# Patient Record
Sex: Female | Born: 1965 | Race: Black or African American | Hispanic: No | Marital: Married | Smoking: Never smoker
Health system: Southern US, Community
[De-identification: ages and names within clinical notes are randomized; demographics above are authoritative.]

## PROBLEM LIST (undated history)

## (undated) DIAGNOSIS — E785 Hyperlipidemia, unspecified: Secondary | ICD-10-CM

## (undated) DIAGNOSIS — K259 Gastric ulcer, unspecified as acute or chronic, without hemorrhage or perforation: Secondary | ICD-10-CM

## (undated) DIAGNOSIS — R12 Heartburn: Secondary | ICD-10-CM

---

## 2015-11-13 ENCOUNTER — Emergency Department (HOSPITAL_COMMUNITY)
Admission: EM | Admit: 2015-11-13 | Discharge: 2015-11-14 | Disposition: A | Discharge status: Home / Self Care | Payer: Self-pay | Attending: Emergency Medicine | Admitting: Emergency Medicine

## 2015-11-13 ENCOUNTER — Emergency Department (HOSPITAL_COMMUNITY): Payer: Self-pay

## 2015-11-13 ENCOUNTER — Encounter (HOSPITAL_COMMUNITY): Payer: Self-pay | Admitting: Emergency Medicine

## 2015-11-13 ENCOUNTER — Other Ambulatory Visit: Payer: Self-pay

## 2015-11-13 DIAGNOSIS — R079 Chest pain, unspecified: Secondary | ICD-10-CM | POA: Insufficient documentation

## 2015-11-13 DIAGNOSIS — K5900 Constipation, unspecified: Secondary | ICD-10-CM | POA: Insufficient documentation

## 2015-11-13 DIAGNOSIS — Z8639 Personal history of other endocrine, nutritional and metabolic disease: Secondary | ICD-10-CM | POA: Insufficient documentation

## 2015-11-13 DIAGNOSIS — F419 Anxiety disorder, unspecified: Secondary | ICD-10-CM | POA: Insufficient documentation

## 2015-11-13 DIAGNOSIS — R1013 Epigastric pain: Secondary | ICD-10-CM

## 2015-11-13 DIAGNOSIS — R55 Syncope and collapse: Secondary | ICD-10-CM | POA: Insufficient documentation

## 2015-11-13 DIAGNOSIS — N390 Urinary tract infection, site not specified: Secondary | ICD-10-CM | POA: Insufficient documentation

## 2015-11-13 HISTORY — DX: Heartburn: R12

## 2015-11-13 HISTORY — DX: Gastric ulcer, unspecified as acute or chronic, without hemorrhage or perforation: K25.9

## 2015-11-13 HISTORY — DX: Hyperlipidemia, unspecified: E78.5

## 2015-11-13 LAB — CBC
HEMATOCRIT: 34.1 % — AB (ref 36.0–46.0)
HEMOGLOBIN: 11.4 g/dL — AB (ref 12.0–15.0)
MCH: 30.2 pg (ref 26.0–34.0)
MCHC: 33.4 g/dL (ref 30.0–36.0)
MCV: 90.5 fL (ref 78.0–100.0)
Platelets: 169 10*3/uL (ref 150–400)
RBC: 3.77 MIL/uL — AB (ref 3.87–5.11)
RDW: 14.6 % (ref 11.5–15.5)
WBC: 6.2 10*3/uL (ref 4.0–10.5)

## 2015-11-13 LAB — BASIC METABOLIC PANEL
Anion gap: 12 (ref 5–15)
BUN: 17 mg/dL (ref 6–20)
CHLORIDE: 104 mmol/L (ref 101–111)
CO2: 23 mmol/L (ref 22–32)
Calcium: 9.5 mg/dL (ref 8.9–10.3)
Creatinine, Ser: 0.78 mg/dL (ref 0.44–1.00)
GFR calc non Af Amer: >60 mL/min (ref >60)
Glucose, Bld: 88 mg/dL (ref 65–99)
POTASSIUM: 4 mmol/L (ref 3.5–5.1)
SODIUM: 139 mmol/L (ref 135–145)

## 2015-11-13 LAB — D-DIMER, QUANTITATIVE (NOT AT ARMC): D DIMER QUANT: 0.48 ug{FEU}/mL (ref 0.00–0.50)

## 2015-11-13 LAB — I-STAT TROPONIN, ED: Troponin i, poc: 0 ng/mL (ref 0.00–0.08)

## 2015-11-13 MED ORDER — ONDANSETRON 4 MG PO TBDP
4.0000 mg | ORAL_TABLET | Freq: Once | ORAL | Status: AC
  Administered 2015-11-13: 4 mg via ORAL
  Filled 2015-11-13: qty 1

## 2015-11-13 MED ORDER — GI COCKTAIL ~~LOC~~
30.0000 mL | Freq: Once | ORAL | Status: AC
  Administered 2015-11-13: 30 mL via ORAL
  Filled 2015-11-13: qty 30

## 2015-11-13 NOTE — ED Notes (Signed)
Cup of ice water provided to patient for fluid challenge.  

## 2015-11-13 NOTE — ED Provider Notes (Signed)
CSN: [id number]     Arrival date & time 11/13/15  1909 History   First MD Initiated Contact with Patient 11/13/15 1913     Chief Complaint  Patient presents with  . Chest Pain     (Consider location/radiation/quality/duration/timing/severity/associated sxs/prior Treatment) The history is provided by the patient and a relative. The history is limited by a language barrier. A language interpreter was used (Patient children assisted with language interpretation).     Patient is a 50 year old female, history of heartburn, gastric ulcers, hyperlipidemia, who presents to the ER for low sternal and epigastric pain with radiation around her left abdomen and side and to her back with associated nausea and 2 episodes of vomiting. The pain is described as burning, rated 10 out of 10 at its worst. Patient states that she has had 2 episodes in the last week similar to this where she feels anxious, her heart pounding rapidly, she hyperventilates, feels nauseated, then vomits and feels much better.  She occasionally has dyspepsia with nausea, and pain and nausea are relieved by belching. Her daughter states that when this occurred approximately one week ago had a syncopal episode.  The daughter explained that she called 911 today because this is the first time she had ever seen one of her mother's episodes, since she just moved here from [place].  She called 911 because she did not want her mother to have another syncopal episode.  The patient states, and her son confirms that she has had episodes like this regularly with feelings of anxiety, for the past 2 decades.  The patient currently denies chest pain, chest tightness, shortness of breath, cough, wheeze, fever, chills, diarrhea, lower extremity edema, lower extremity erythema, lightheadedness, headache, neck pain.  Patient has had no change in the frequency of her symptoms since flying to [place] 2 weeks ago.  Patient also reports chronic constiptaiton, and  often has one week in between her bowel movements.  Her last bowel movement was yesterday. She denies melena, hematochezia, hematemesis.  The patient also states that she's been having dysuria and tenderness across her lower abdomen that when she presses on it radiates pain across her upper abdomen.  The dysuria has been intermittent. She denies hematuria, flank pain, vaginal symptoms.  Past Medical History  Diagnosis Date  . Hyperlipidemia   . Heartburn   . Gastric ulcer    History reviewed. No pertinent past surgical history. History reviewed. No pertinent family history. Social History  Substance Use Topics  . Smoking status: Never Smoker   . Smokeless tobacco: None  . Alcohol Use: No   OB History    No data available     Review of Systems  Constitutional: Negative for fever, chills, diaphoresis, activity change, appetite change and fatigue.  HENT: Negative.   Gastrointestinal: Positive for nausea, vomiting, abdominal pain and constipation. Negative for diarrhea, blood in stool, abdominal distention, anal bleeding and rectal pain.  Genitourinary: Positive for dysuria, urgency and frequency. Negative for difficulty urinating.  Musculoskeletal: Negative.   Skin: Negative.   Neurological: Positive for syncope. Negative for tremors and weakness.  Hematological: Negative.   Psychiatric/Behavioral: The patient is nervous/anxious.   All other systems reviewed and are negative.     Allergies  Review of patient's allergies indicates no known allergies.  Home Medications   Prior to Admission medications   Medication Sig Start Date End Date Taking? Authorizing Provider  cephALEXin (KEFLEX) 500 MG capsule Take 1 capsule (500 mg total) by mouth 4 (  four) times daily. 11/14/15   Danelle Berry, PA-C  pantoprazole (PROTONIX) 20 MG tablet Take 1 tablet (20 mg total) by mouth daily. 11/14/15   Danelle Berry, PA-C  polyethylene glycol powder (GLYCOLAX/MIRALAX) powder Take 17 g by mouth 2 (two)  times daily. 11/14/15   Ishita Mcnerney, PA-C   BP 101/60 mmHg  Pulse 61  Temp(Src) 98 F (36.7 C) (Oral)  Resp 12  SpO2 99% Physical Exam  Constitutional: She is oriented to person, place, and time. She appears well-developed and well-nourished. No distress.  HENT:  Head: Normocephalic and atraumatic.  Nose: Nose normal.  Mouth/Throat: No oropharyngeal exudate.  Oral mucosa dry  Eyes: Conjunctivae and EOM are normal. Pupils are equal, round, and reactive to light. Right eye exhibits no discharge. Left eye exhibits no discharge. No scleral icterus.  Neck: Normal range of motion. No JVD present. No tracheal deviation present. No thyromegaly present.  Cardiovascular: Normal rate, regular rhythm, normal heart sounds and intact distal pulses.  Exam reveals no gallop and no friction rub.   No murmur heard. Symmetrical 2+ radial and DP pulses No LE edema  Pulmonary/Chest: Effort normal and breath sounds normal. No respiratory distress. She has no wheezes. She has no rales. She exhibits no tenderness.  Abdominal: Soft. Normal appearance and bowel sounds are normal. She exhibits no distension, no fluid wave, no ascites and no mass. There is generalized tenderness. There is no rigidity, no rebound, no guarding, no CVA tenderness, no tenderness at McBurney's point and negative Murphy's sign.    Musculoskeletal: Normal range of motion. She exhibits no edema or tenderness.  Lymphadenopathy:    She has no cervical adenopathy.  Neurological: She is alert and oriented to person, place, and time. She has normal reflexes. No cranial nerve deficit. She exhibits normal muscle tone. Coordination normal.  Skin: Skin is warm and dry. No rash noted. She is not diaphoretic. No erythema. No pallor.  Psychiatric: She has a normal mood and affect. Her behavior is normal. Judgment and thought content normal.  Nursing note and vitals reviewed.   ED Course  Procedures (including critical care time) Labs  Review Labs Reviewed  CBC - Abnormal; Notable for the following:    RBC 3.77 (*)    Hemoglobin 11.4 (*)    HCT 34.1 (*)    All other components within normal limits  URINALYSIS, ROUTINE W REFLEX MICROSCOPIC (NOT AT [facility]) - Abnormal; Notable for the following:    APPearance CLOUDY (*)    Leukocytes, UA MODERATE (*)    All other components within normal limits  URINE MICROSCOPIC-ADD ON - Abnormal; Notable for the following:    Squamous Epithelial / LPF 0-5 (*)    Bacteria, UA FEW (*)    All other components within normal limits  BASIC METABOLIC PANEL  D-DIMER, QUANTITATIVE (NOT AT [facility])  I-STAT TROPOININ, ED  I-STAT TROPOININ, ED  Rosezena Sensor, ED    Imaging Review Dg Chest 2 View  11/13/2015  CLINICAL DATA:  Chest pain for 1 day EXAM: CHEST  2 VIEW COMPARISON:  None. FINDINGS: Mild cardiomegaly. No overt edema, confluent opacity or effusion. No acute bony abnormality. IMPRESSION: Cardiomegaly.  No active disease. Electronically Signed   By: Charlett Nose M.D.   On: 11/13/2015 20:13   Dg Abd 2 Views  11/13/2015  CLINICAL DATA:  50 year old female with epigastric pain and nausea intermittently for the past several months. EXAM: ABDOMEN - 2 VIEW COMPARISON:  No priors. FINDINGS: Gas and stool are seen  scattered throughout the colon extending to the level of the distal rectum. No pathologic distension of small bowel is noted. No gross evidence of pneumoperitoneum. Multiple calcific densities projecting over the lower abdomen and pelvis bilaterally, presumably soft tissue calcifications in the overlying skin and subcutaneous fat. IMPRESSION: 1.  Nonobstructive bowel gas pattern. 2. No pneumoperitoneum. Electronically Signed   By: Trudie Reed M.D.   On: 11/13/2015 22:15   I have personally reviewed and evaluated these images and lab results as part of my medical decision-making.   EKG Interpretation   Date/Time:  Tuesday November 13 2015 19:48:23 EST Ventricular Rate:  65 PR  Interval:  146 QRS Duration: 80 QT Interval:  427 QTC Calculation: 444 R Axis:   78 Text Interpretation:  Sinus rhythm Nonspecific T abnrm, anterolateral  leads ST elev, probable normal early repol pattern no significant change  since earlier in the day Confirmed by GOLDSTON  MD, SCOTT ([id number]) on  11/13/2015 8:06:46 PM      MDM   Patient arrived to the ER via EMS with complaints of chest pain.  When obtaining history the patient states that she has only had chest and epigastric pain associated with anxiety, with rapid heart rate, hyperventilation, nausea and vomiting which then relieved all of her symptoms.  Cardiac workup was initiated prior to my evaluation.  In route EMS administered Zofran, 324 aspirin and 2 nitroglycerin.  The patient was well-appearing, in no distress, vital signs stable History was more concerning for epigastric abdominal pain, GERD versus gastritis versus peptic ulcer disease.  Patient's chest pain was not consistent with ACS however given limited history due to language barriers and reports of syncope with rapid heart rate, CP work up completed.  Chest x-ray, two-view of the abdomen, d-dimer and urinalysis added.  The patient later reported dysuria and chronic constipation.  She reported significant improvement in her epigastric pain with GI cocktail.  Initial troponin negative, labs pertinent for mild anemia, d-dimer negative, chest x-ray negative for acute cardiopulmonary pathology, mild cardiomegaly.  Abdominal x-rays reveal gas and stool scattered throughout, consistent with patient's history.  Urine consistent with UTI.  Delta troponin negative.  The patient was presented to Dr. Criss Alvine, who is personally seen and evaluated the patient and agrees with workup and assessment.  Patient has been monitored for several hours in the ER without any repeated episodes.  She has had no nausea, vomiting and vital signs are stable and within normal limits.  Feel patient can  safely discharge with outpatient follow-up.  Patient will be treated with PPI trial is for upper GI symptoms, given MiraLAX for chronic constipation, and given Keflex for UTI.  Pt d/c home in stable condition.  VSS  Filed Vitals:   11/13/15 2130 11/13/15 2345 11/14/15 0015 11/14/15 0045  BP: 114/74 108/80 121/58 101/60  Pulse: 61 62 53 61  Temp:      TempSrc:      Resp: 12     SpO2: 96% 99% 96% 99%    Final diagnoses:  Epigastric pain  Constipation, unspecified constipation type  UTI (lower urinary tract infection)  Chest pain, unspecified chest pain type     Danelle Berry, PA-C 11/14/15 0207  Pricilla Loveless, MD 11/18/15 [phone]

## 2015-11-13 NOTE — ED Notes (Signed)
Pt arrives by [facility] with c/o of chest pain. Woke up this morning with pain in mid sternal region radiating to right side and back 10/10 as well as upper abdominal pain, nausea and vomiting. Denies fever, chills, and diarrhea. Pt has been in [place] for 2 weeks from [place]. EMS 12-lead shows NSR. 22G placed in left hand,  Zofran, 324 aspirin and 2 nitro given. CBG 96. Last vitals 124/72, P 80, 97% RA.

## 2015-11-14 LAB — URINALYSIS, ROUTINE W REFLEX MICROSCOPIC
BILIRUBIN URINE: NEGATIVE
GLUCOSE, UA: NEGATIVE mg/dL
HGB URINE DIPSTICK: NEGATIVE
KETONES UR: NEGATIVE mg/dL
Nitrite: NEGATIVE
PROTEIN: NEGATIVE mg/dL
Specific Gravity, Urine: 1.012 (ref 1.005–1.030)
pH: 7.5 (ref 5.0–8.0)

## 2015-11-14 LAB — I-STAT TROPONIN, ED: TROPONIN I, POC: 0.03 ng/mL (ref 0.00–0.08)

## 2015-11-14 LAB — URINE MICROSCOPIC-ADD ON

## 2015-11-14 MED ORDER — CEPHALEXIN 500 MG PO CAPS: 500.0000 mg | ORAL_CAPSULE | Freq: Four times a day (QID) | ORAL | Status: DC

## 2015-11-14 MED ORDER — POLYETHYLENE GLYCOL 3350 17 GM/SCOOP PO POWD: 17.0000 g | Freq: Two times a day (BID) | ORAL | Status: DC

## 2015-11-14 MED ORDER — PANTOPRAZOLE SODIUM 20 MG PO TBEC: 20.0000 mg | DELAYED_RELEASE_TABLET | Freq: Every day | ORAL | Status: DC

## 2015-11-14 NOTE — Discharge Instructions (Signed)
Abdominal Pain, Adult °Many things can cause abdominal pain. Usually, abdominal pain is not caused by a disease and will improve without treatment. It can often be observed and treated at home. Your health care provider will do a physical exam and possibly order blood tests and X-rays to help determine the seriousness of your pain. However, in many cases, more time must pass before a clear cause of the pain can be found. Before that point, your health care provider may not know if you need more testing or further treatment. °HOME CARE INSTRUCTIONS °Monitor your abdominal pain for any changes. The following actions may help to alleviate any discomfort you are experiencing: °· Only take over-the-counter or prescription medicines as directed by your health care provider. °· Do not take laxatives unless directed to do so by your health care provider. °· Try a clear liquid diet (broth, tea, or water) as directed by your health care provider. Slowly move to a bland diet as tolerated. °SEEK MEDICAL CARE IF: °· You have unexplained abdominal pain. °· You have abdominal pain associated with nausea or diarrhea. °· You have pain when you urinate or have a bowel movement. °· You experience abdominal pain that wakes you in the night. °· You have abdominal pain that is worsened or improved by eating food. °· You have abdominal pain that is worsened with eating fatty foods. °· You have a fever. °SEEK IMMEDIATE MEDICAL CARE IF: °· Your pain does not go away within 2 hours. °· You keep throwing up (vomiting). °· Your pain is felt only in portions of the abdomen, such as the right side or the left lower portion of the abdomen. °· You pass bloody or black tarry stools. °MAKE SURE YOU: °· Understand these instructions. °· Will watch your condition. °· Will get help right away if you are not doing well or get worse. °  °This information is not intended to replace advice given to you by your health care provider. Make sure you discuss  any questions you have with your health care provider. °  °Document Released: 07/23/2005 Document Revised: 07/04/2015 Document Reviewed: 06/22/2013 °Elsevier Interactive Patient Education ©2016 [employer] ° °Constipation, Adult °Constipation is when a person: °· Poops (has a bowel movement) less than 3 times a week. °· Has a hard time pooping. °· Has poop that is dry, hard, or bigger than normal. °HOME CARE  °· Eat foods with a lot of fiber in them. This includes fruits, vegetables, beans, and whole grains such as brown rice. °· Avoid fatty foods and foods with a lot of sugar. This includes french fries, hamburgers, cookies, candy, and soda. °· If you are not getting enough fiber from food, take products with added fiber in them (supplements). °· Drink enough fluid to keep your pee (urine) clear or pale yellow. °· Exercise on a regular basis, or as told by your doctor. °· Go to the restroom when you feel like you need to poop. Do not hold it. °· Only take medicine as told by your doctor. Do not take medicines that help you poop (laxatives) without talking to your doctor first. °GET HELP RIGHT AWAY IF:  °· You have bright red blood in your poop (stool). °· Your constipation lasts more than 4 days or gets worse. °· You have belly (abdominal) or butt (rectal) pain. °· You have thin poop (as thin as a pencil). °· You lose weight, and it cannot be explained. °MAKE SURE YOU:  °· Understand these instructions. °·   Will watch your condition.  Will get help right away if you are not doing well or get worse.   This information is not intended to replace advice given to you by your health care provider. Make sure you discuss any questions you have with your health care provider.   Document Released: 03/31/2008 Document Revised: 11/03/2014 Document Reviewed: 07/25/2013 Elsevier Interactive Patient Education 2016 [employer]  Dysuria Dysuria is pain or discomfort while urinating. The pain or discomfort may be felt  in the tube that carries urine out of the bladder (urethra) or in the surrounding tissue of the genitals. The pain may also be felt in the groin area, lower abdomen, and lower back. You may have to urinate frequently or have the sudden feeling that you have to urinate (urgency). Dysuria can affect both men and women, but is more common in women. Dysuria can be caused by many different things, including:  Urinary tract infection in women.  Infection of the kidney or bladder.  Kidney stones or bladder stones.  Certain sexually transmitted infections (STIs), such as chlamydia.  Dehydration.  Inflammation of the vagina.  Use of certain medicines.  Use of certain soaps or scented products that cause irritation. HOME CARE INSTRUCTIONS Watch your dysuria for any changes. The following actions may help to reduce any discomfort you are feeling:  Drink enough fluid to keep your urine clear or pale yellow.  Empty your bladder often. Avoid holding urine for long periods of time.  After a bowel movement or urination, women should cleanse from front to back, using each tissue only once.  Empty your bladder after sexual intercourse.  Take medicines only as directed by your health care provider.  If you were prescribed an antibiotic medicine, finish it all even if you start to feel better.  Avoid caffeine, tea, and alcohol. They can irritate the bladder and make dysuria worse. In men, alcohol may irritate the prostate.  Keep all follow-up visits as directed by your health care provider. This is important.  If you had any tests done to find the cause of dysuria, it is your responsibility to obtain your test results. Ask the lab or department performing the test when and how you will get your results. Talk with your health care provider if you have any questions about your results. SEEK MEDICAL CARE IF:  You develop pain in your back or sides.  You have a fever.  You have nausea or  vomiting.  You have blood in your urine.  You are not urinating as often as you usually do. SEEK IMMEDIATE MEDICAL CARE IF:  You pain is severe and not relieved with medicines.  You are unable to hold down any fluids.  You or someone else notices a change in your mental function.  You have a rapid heartbeat at rest.  You have shaking or chills.  You feel extremely weak.   This information is not intended to replace advice given to you by your health care provider. Make sure you discuss any questions you have with your health care provider.   Document Released: 07/11/2004 Document Revised: 11/03/2014 Document Reviewed: 06/08/2014 Elsevier Interactive Patient Education 2016 [employer]  Nonspecific Chest Pain  Chest pain can be caused by many different conditions. There is always a chance that your pain could be related to something serious, such as a heart attack or a blood clot in your lungs. Chest pain can also be caused by conditions that are not life-threatening. If you  have chest pain, it is very important to follow up with your health care provider. CAUSES  Chest pain can be caused by:  Heartburn.  Pneumonia or bronchitis.  Anxiety or stress.  Inflammation around your heart (pericarditis) or lung (pleuritis or pleurisy).  A blood clot in your lung.  A collapsed lung (pneumothorax). It can develop suddenly on its own (spontaneous pneumothorax) or from trauma to the chest.  Shingles infection (varicella-zoster virus).  Heart attack.  Damage to the bones, muscles, and cartilage that make up your chest wall. This can include:  Bruised bones due to injury.  Strained muscles or cartilage due to frequent or repeated coughing or overwork.  Fracture to one or more ribs.  Sore cartilage due to inflammation (costochondritis). RISK FACTORS  Risk factors for chest pain may include:  Activities that increase your risk for trauma or injury to your  chest.  Respiratory infections or conditions that cause frequent coughing.  Medical conditions or overeating that can cause heartburn.  Heart disease or family history of heart disease.  Conditions or health behaviors that increase your risk of developing a blood clot.  Having had chicken pox (varicella zoster). SIGNS AND SYMPTOMS Chest pain can feel like:  Burning or tingling on the surface of your chest or deep in your chest.  Crushing, pressure, aching, or squeezing pain.  Dull or sharp pain that is worse when you move, cough, or take a deep breath.  Pain that is also felt in your back, neck, shoulder, or arm, or pain that spreads to any of these areas. Your chest pain may come and go, or it may stay constant. DIAGNOSIS Lab tests or other studies may be needed to find the cause of your pain. Your health care provider may have you take a test called an ambulatory ECG (electrocardiogram). An ECG records your heartbeat patterns at the time the test is performed. You may also have other tests, such as:  Transthoracic echocardiogram (TTE). During echocardiography, sound waves are used to create a picture of all of the heart structures and to look at how blood flows through your heart.  Transesophageal echocardiogram (TEE).This is a more advanced imaging test that obtains images from inside your body. It allows your health care provider to see your heart in finer detail.  Cardiac monitoring. This allows your health care provider to monitor your heart rate and rhythm in real time.  Holter monitor. This is a portable device that records your heartbeat and can help to diagnose abnormal heartbeats. It allows your health care provider to track your heart activity for several days, if needed.  Stress tests. These can be done through exercise or by taking medicine that makes your heart beat more quickly.  Blood tests.  Imaging tests. TREATMENT  Your treatment depends on what is causing  your chest pain. Treatment may include:  Medicines. These may include:  Acid blockers for heartburn.  Anti-inflammatory medicine.  Pain medicine for inflammatory conditions.  Antibiotic medicine, if an infection is present.  Medicines to dissolve blood clots.  Medicines to treat coronary artery disease.  Supportive care for conditions that do not require medicines. This may include:  Resting.  Applying heat or cold packs to injured areas.  Limiting activities until pain decreases. HOME CARE INSTRUCTIONS  If you were prescribed an antibiotic medicine, finish it all even if you start to feel better.  Avoid any activities that bring on chest pain.  Do not use any tobacco products, including cigarettes, chewing  tobacco, or electronic cigarettes. If you need help quitting, ask your health care provider.  Do not drink alcohol.  Take medicines only as directed by your health care provider.  Keep all follow-up visits as directed by your health care provider. This is important. This includes any further testing if your chest pain does not go away.  If heartburn is the cause for your chest pain, you may be told to keep your head raised (elevated) while sleeping. This reduces the chance that acid will go from your stomach into your esophagus.  Make lifestyle changes as directed by your health care provider. These may include:  Getting regular exercise. Ask your health care provider to suggest some activities that are safe for you.  Eating a heart-healthy diet. A registered dietitian can help you to learn healthy eating options.  Maintaining a healthy weight.  Managing diabetes, if necessary.  Reducing stress. SEEK MEDICAL CARE IF:  Your chest pain does not go away after treatment.  You have a rash with blisters on your chest.  You have a fever. SEEK IMMEDIATE MEDICAL CARE IF:   Your chest pain is worse.  You have an increasing cough, or you cough up blood.  You  have severe abdominal pain.  You have severe weakness.  You faint.  You have chills.  You have sudden, unexplained chest discomfort.  You have sudden, unexplained discomfort in your arms, back, neck, or jaw.  You have shortness of breath at any time.  You suddenly start to sweat, or your skin gets clammy.  You feel nauseous or you vomit.  You suddenly feel light-headed or dizzy.  Your heart begins to beat quickly, or it feels like it is skipping beats. These symptoms may represent a serious problem that is an emergency. Do not wait to see if the symptoms will go away. Get medical help right away. Call your local emergency services (911 in the [place] Do not drive yourself to the hospital.   This information is not intended to replace advice given to you by your health care provider. Make sure you discuss any questions you have with your health care provider.   Document Released: 07/23/2005 Document Revised: 11/03/2014 Document Reviewed: 05/19/2014 Elsevier Interactive Patient Education 2016 [employer]  Urinary Tract Infection Urinary tract infections (UTIs) can develop anywhere along your urinary tract. Your urinary tract is your body's drainage system for removing wastes and extra water. Your urinary tract includes two kidneys, two ureters, a bladder, and a urethra. Your kidneys are a pair of bean-shaped organs. Each kidney is about the size of your fist. They are located below your ribs, one on each side of your spine. CAUSES Infections are caused by microbes, which are microscopic organisms, including fungi, viruses, and bacteria. These organisms are so small that they can only be seen through a microscope. Bacteria are the microbes that most commonly cause UTIs. SYMPTOMS  Symptoms of UTIs may vary by age and gender of the patient and by the location of the infection. Symptoms in young women typically include a frequent and intense urge to urinate and a painful, burning feeling  in the bladder or urethra during urination. Older women and men are more likely to be tired, shaky, and weak and have muscle aches and abdominal pain. A fever may mean the infection is in your kidneys. Other symptoms of a kidney infection include pain in your back or sides below the ribs, nausea, and vomiting. DIAGNOSIS To diagnose a UTI, your caregiver  will ask you about your symptoms. Your caregiver will also ask you to provide a urine sample. The urine sample will be tested for bacteria and white blood cells. White blood cells are made by your body to help fight infection. TREATMENT  Typically, UTIs can be treated with medication. Because most UTIs are caused by a bacterial infection, they usually can be treated with the use of antibiotics. The choice of antibiotic and length of treatment depend on your symptoms and the type of bacteria causing your infection. HOME CARE INSTRUCTIONS  If you were prescribed antibiotics, take them exactly as your caregiver instructs you. Finish the medication even if you feel better after you have only taken some of the medication.  Drink enough water and fluids to keep your urine clear or pale yellow.  Avoid caffeine, tea, and carbonated beverages. They tend to irritate your bladder.  Empty your bladder often. Avoid holding urine for long periods of time.  Empty your bladder before and after sexual intercourse.  After a bowel movement, women should cleanse from front to back. Use each tissue only once. SEEK MEDICAL CARE IF:   You have back pain.  You develop a fever.  Your symptoms do not begin to resolve within 3 days. SEEK IMMEDIATE MEDICAL CARE IF:   You have severe back pain or lower abdominal pain.  You develop chills.  You have nausea or vomiting.  You have continued burning or discomfort with urination. MAKE SURE YOU:   Understand these instructions.  Will watch your condition.  Will get help right away if you are not doing well or  get worse.   This information is not intended to replace advice given to you by your health care provider. Make sure you discuss any questions you have with your health care provider.   Document Released: 07/23/2005 Document Revised: 07/04/2015 Document Reviewed: 11/21/2011 Elsevier Interactive Patient Education 2016 [employer]  Gastritis, Adult Gastritis is soreness and swelling (inflammation) of the lining of the stomach. Gastritis can develop as a sudden onset (acute) or long-term (chronic) condition. If gastritis is not treated, it can lead to stomach bleeding and ulcers. CAUSES  Gastritis occurs when the stomach lining is weak or damaged. Digestive juices from the stomach then inflame the weakened stomach lining. The stomach lining may be weak or damaged due to viral or bacterial infections. One common bacterial infection is the Helicobacter pylori infection. Gastritis can also result from excessive alcohol consumption, taking certain medicines, or having too much acid in the stomach.  SYMPTOMS  In some cases, there are no symptoms. When symptoms are present, they may include:  Pain or a burning sensation in the upper abdomen.  Nausea.  Vomiting.  An uncomfortable feeling of fullness after eating. DIAGNOSIS  Your caregiver may suspect you have gastritis based on your symptoms and a physical exam. To determine the cause of your gastritis, your caregiver may perform the following:  Blood or stool tests to check for the H pylori bacterium.  Gastroscopy. A thin, flexible tube (endoscope) is passed down the esophagus and into the stomach. The endoscope has a light and camera on the end. Your caregiver uses the endoscope to view the inside of the stomach.  Taking a tissue sample (biopsy) from the stomach to examine under a microscope. TREATMENT  Depending on the cause of your gastritis, medicines may be prescribed. If you have a bacterial infection, such as an H pylori infection,  antibiotics may be given. If your gastritis is  caused by too much acid in the stomach, H2 blockers or antacids may be given. Your caregiver may recommend that you stop taking aspirin, ibuprofen, or other nonsteroidal anti-inflammatory drugs (NSAIDs). HOME CARE INSTRUCTIONS  Only take over-the-counter or prescription medicines as directed by your caregiver.  If you were given antibiotic medicines, take them as directed. Finish them even if you start to feel better.  Drink enough fluids to keep your urine clear or pale yellow.  Avoid foods and drinks that make your symptoms worse, such as:  Caffeine or alcoholic drinks.  Chocolate.  Peppermint or mint flavorings.  Garlic and onions.  Spicy foods.  Citrus fruits, such as oranges, lemons, or limes.  Tomato-based foods such as sauce, chili, salsa, and pizza.  Fried and fatty foods.  Eat small, frequent meals instead of large meals. SEEK IMMEDIATE MEDICAL CARE IF:   You have black or dark red stools.  You vomit blood or material that looks like coffee grounds.  You are unable to keep fluids down.  Your abdominal pain gets worse.  You have a fever.  You do not feel better after 1 week.  You have any other questions or concerns. MAKE SURE YOU:  Understand these instructions.  Will watch your condition.  Will get help right away if you are not doing well or get worse.   This information is not intended to replace advice given to you by your health care provider. Make sure you discuss any questions you have with your health care provider.   Document Released: 10/07/2001 Document Revised: 04/13/2012 Document Reviewed: 11/26/2011 Elsevier Interactive Patient Education [employer]

## 2015-12-28 ENCOUNTER — Emergency Department (HOSPITAL_COMMUNITY): Payer: Self-pay

## 2015-12-28 ENCOUNTER — Emergency Department (HOSPITAL_COMMUNITY)
Admission: EM | Admit: 2015-12-28 | Discharge: 2015-12-28 | Disposition: A | Discharge status: Home / Self Care | Payer: Self-pay | Attending: Emergency Medicine | Admitting: Emergency Medicine

## 2015-12-28 ENCOUNTER — Encounter (HOSPITAL_COMMUNITY): Payer: Self-pay

## 2015-12-28 DIAGNOSIS — Z8719 Personal history of other diseases of the digestive system: Secondary | ICD-10-CM | POA: Insufficient documentation

## 2015-12-28 DIAGNOSIS — M25461 Effusion, right knee: Secondary | ICD-10-CM | POA: Insufficient documentation

## 2015-12-28 DIAGNOSIS — Z79899 Other long term (current) drug therapy: Secondary | ICD-10-CM | POA: Insufficient documentation

## 2015-12-28 DIAGNOSIS — Z8639 Personal history of other endocrine, nutritional and metabolic disease: Secondary | ICD-10-CM | POA: Insufficient documentation

## 2015-12-28 DIAGNOSIS — Z792 Long term (current) use of antibiotics: Secondary | ICD-10-CM | POA: Insufficient documentation

## 2015-12-28 NOTE — Discharge Instructions (Signed)
1. Medications: Take tylenol (tylenol extra strength or tylenol arthritis) as needed for pain, continue usual home medications 2. Treatment: rest, drink plenty of fluids, ice affected area-instructions below, elevate the knee as much as possible throughout the day.  3. Follow Up: Please follow up with the orthopedic clinic listed if no improvement in symptoms after 1-2 weeks for discussion of your diagnoses and further evaluation after today's visit; Please return to the ER for new or worsening symptoms, any additional concerns.  COLD THERAPY DIRECTIONS:  Ice or gel packs can be used to reduce both pain and swelling. Ice is the most helpful within the first 24 to 48 hours after an injury or flareup from overusing a muscle or joint.  Ice is effective, has very few side effects, and is safe for most people to use.   If you expose your skin to cold temperatures for too long or without the proper protection, you can damage your skin or nerves. Watch for signs of skin damage due to cold.   HOME CARE INSTRUCTIONS  Follow these tips to use ice and cold packs safely.  Place a dry or damp towel between the ice and skin. A damp towel will cool the skin more quickly, so you may need to shorten the time that the ice is used.  For a more rapid response, add gentle compression to the ice.  Ice for no more than 10 to 20 minutes at a time. The bonier the area you are icing, the less time it will take to get the benefits of ice.  Check your skin after 5 minutes to make sure there are no signs of a poor response to cold or skin damage.  Rest 20 minutes or more in between uses.  Once your skin is numb, you can end your treatment. You can test numbness by very lightly touching your skin. The touch should be so light that you do not see the skin dimple from the pressure of your fingertip. When using ice, most people will feel these normal sensations in this order: cold, burning, aching, and numbness.

## 2015-12-28 NOTE — ED Provider Notes (Signed)
CSN: [id number]     Arrival date & time 12/28/15  2009 History  By signing my name below, I, Shelby Vargas, attest that this documentation has been prepared under the direction and in the presence of [employer], [employer]. Electronically Signed: Randell Patient, ED Scribe. 12/28/2015. 11:26 PM.   Chief Complaint  Patient presents with  . Knee Pain    The history is provided by the patient and a relative. No language interpreter was used (Family at bedside aiding in translation as needed. ).   HPI Comments: Shelby Vargas is a 50 y.o. female who presents to the Emergency Department complaining of right knee pain onset yesterday. She states that she started a new job 2 weeks ago that requires long periods of standing. She endorses associated swelling in the right leg and difficulty ambulating secondary to pain and swelling. Pain worse with movement and bearing weight. She denies fever currently, cough, and SOB.  Past Medical History  Diagnosis Date  . Hyperlipidemia   . Heartburn   . Gastric ulcer    History reviewed. No pertinent past surgical history. No family history on file. Social History  Substance Use Topics  . Smoking status: Never Smoker   . Smokeless tobacco: None  . Alcohol Use: No   OB History    No data available     Review of Systems  Constitutional: Negative for fever.  Respiratory: Negative for cough and shortness of breath.   Cardiovascular: Positive for leg swelling (right leg).  Musculoskeletal: Positive for arthralgias (right knee).      Allergies  Aspirin  Home Medications   Prior to Admission medications   Medication Sig Start Date End Date Taking? Authorizing Provider  cephALEXin (KEFLEX) 500 MG capsule Take 1 capsule (500 mg total) by mouth 4 (four) times daily. 11/14/15   Danelle Berry, PA-C  pantoprazole (PROTONIX) 20 MG tablet Take 1 tablet (20 mg total) by mouth daily. 11/14/15   Danelle Berry, PA-C  polyethylene glycol powder  (GLYCOLAX/MIRALAX) powder Take 17 g by mouth 2 (two) times daily. 11/14/15   Danelle Berry, PA-C   BP 116/79 mmHg  Pulse 79  Temp(Src) 98 F (36.7 C) (Oral)  Resp 16  SpO2 100% Physical Exam  Constitutional: She is oriented to person, place, and time. She appears well-developed and well-nourished. No distress.  HENT:  Head: Normocephalic and atraumatic.  Neck: Neck supple. No tracheal deviation present.  Cardiovascular: Normal rate, regular rhythm and normal heart sounds.  Exam reveals no gallop and no friction rub.   No murmur heard. Pulmonary/Chest: Effort normal and breath sounds normal. No respiratory distress. She has no wheezes. She has no rales.  Musculoskeletal:  Right knee: + swelling. No gross deformity noted. Knee with decreased ROM 2/2 pain. No joint line or bony TTP. TTP of anterolateral knee. No abnormal alignment or patellar mobility. No bruising, erythema, or warmth overlaying the joint. No varus/valgus laxity.No crepitus.  2+ DP pulse's bilaterally. All compartments are soft. Sensation intact distal to injury.  Neurological: She is alert and oriented to person, place, and time.  Skin: Skin is warm and dry.  Psychiatric: She has a normal mood and affect. Her behavior is normal.  Nursing note and vitals reviewed.   ED Course  Procedures   DIAGNOSTIC STUDIES: Oxygen Saturation is 100% on RA, normal by my interpretation.    COORDINATION OF CARE: 9:20 PM Will order right knee imaging. Discussed treatment plan with pt at bedside and pt agreed to plan.  Labs Review Labs Reviewed - No data to display  Imaging Review Dg Knee Complete 4 Views Right  12/28/2015  CLINICAL DATA:  Acute onset RIGHT knee pain for 2 days, swelling. No injury. EXAM: RIGHT KNEE - COMPLETE 4+ VIEW COMPARISON:  None. FINDINGS: There is no evidence of fracture, dislocation, or joint effusion. There is no evidence of arthropathy or other focal bone abnormality. Small suprapatellar joint effusion. No  subcutaneous gas or radiopaque foreign bodies. IMPRESSION: Small suprapatellar joint effusion.  Otherwise negative CT knee. Electronically Signed   By: Awilda Metroourtnay  Bloomer M.D.   On: 12/28/2015 21:57   I have personally reviewed and evaluated these images and lab results as part of my medical decision-making.   EKG Interpretation None      MDM   Final diagnoses:  Knee effusion, right   Shelby Vargas presents with right knee pain and swelling. Patient admits to prolonged standing at a new job. She states this has occurred one time in the past after prolonged standing. X-rays were obtained which show a small suprapatellar joint effusion - no other additional findings. Symptomatic care was discussed, knee sleeve given, crutches provided. RICE therapy discussed. Patient with known history of gastritis versus PUD, therefore Tylenol for pain and NSAIDs deferred. Ortho referral provided if symptoms do not improve. Return precautions discussed. All questions answered.  I personally performed the services described in this documentation, which was scribed in my presence. The recorded information has been reviewed and is accurate.   [facility] Wharton CampusJaime Pilcher Xavion Muscat, PA-C 12/28/15 [id number]  Arby BarretteMarcy Pfeiffer, MD 01/06/16 [phone]

## 2015-12-28 NOTE — ED Notes (Signed)
This RN taught patient proper use of crutches and pt was able to demonstrate proper use of the crutches. Pt ambulatory out of ED with crutches with steady gait, NAD and declined wheelchair

## 2015-12-28 NOTE — ED Notes (Signed)
See PAs notes for secondary assessment.  

## 2015-12-28 NOTE — ED Notes (Addendum)
Pt here for pain and swelling to right knee, onset Thursday. Denies injury. No redness noted. She just started a new job 2 weeks ago and she is required to stand a lot.

## 2016-01-28 ENCOUNTER — Emergency Department (HOSPITAL_COMMUNITY)
Admission: EM | Admit: 2016-01-28 | Discharge: 2016-01-29 | Disposition: A | Discharge status: Home / Self Care | Payer: Self-pay | Attending: Emergency Medicine | Admitting: Emergency Medicine

## 2016-01-28 DIAGNOSIS — Z792 Long term (current) use of antibiotics: Secondary | ICD-10-CM | POA: Insufficient documentation

## 2016-01-28 DIAGNOSIS — Z8719 Personal history of other diseases of the digestive system: Secondary | ICD-10-CM | POA: Insufficient documentation

## 2016-01-28 DIAGNOSIS — M79652 Pain in left thigh: Secondary | ICD-10-CM | POA: Insufficient documentation

## 2016-01-28 DIAGNOSIS — Z79899 Other long term (current) drug therapy: Secondary | ICD-10-CM | POA: Insufficient documentation

## 2016-01-28 DIAGNOSIS — R252 Cramp and spasm: Secondary | ICD-10-CM | POA: Insufficient documentation

## 2016-01-28 DIAGNOSIS — Z8639 Personal history of other endocrine, nutritional and metabolic disease: Secondary | ICD-10-CM | POA: Insufficient documentation

## 2016-01-28 NOTE — ED Notes (Signed)
Bed: WA17 Expected date:  Expected time:  Means of arrival:  Comments: EMS  Leg cramping

## 2016-01-28 NOTE — ED Notes (Signed)
Pt BIB EMS from home. C/o bilateral leg pain and cramping. Just started her job 3 days ago which requires a lot of standing. Pt started vomiting upon EMS arrival. One emesis occurrence.  EMS vitals 128/92, HR 84, RR16, CBG104

## 2016-01-29 LAB — COMPREHENSIVE METABOLIC PANEL
ALBUMIN: 4.3 g/dL (ref 3.5–5.0)
ALK PHOS: 43 U/L (ref 38–126)
ALT: 21 U/L (ref 14–54)
ANION GAP: 8 (ref 5–15)
AST: 28 U/L (ref 15–41)
BUN: 21 mg/dL — ABNORMAL HIGH (ref 6–20)
CALCIUM: 9.7 mg/dL (ref 8.9–10.3)
CHLORIDE: 105 mmol/L (ref 101–111)
CO2: 26 mmol/L (ref 22–32)
Creatinine, Ser: 1.07 mg/dL — ABNORMAL HIGH (ref 0.44–1.00)
GFR calc non Af Amer: 60 mL/min — ABNORMAL LOW (ref >60)
Glucose, Bld: 110 mg/dL — ABNORMAL HIGH (ref 65–99)
POTASSIUM: 4 mmol/L (ref 3.5–5.1)
SODIUM: 139 mmol/L (ref 135–145)
Total Bilirubin: 0.6 mg/dL (ref 0.3–1.2)
Total Protein: 7.4 g/dL (ref 6.5–8.1)

## 2016-01-29 MED ORDER — METHOCARBAMOL 750 MG PO TABS: 750.0000 mg | ORAL_TABLET | Freq: Four times a day (QID) | ORAL | Status: DC

## 2016-01-29 MED ORDER — DIAZEPAM 2 MG PO TABS
2.0000 mg | ORAL_TABLET | Freq: Once | ORAL | Status: AC
  Administered 2016-01-29: 2 mg via ORAL
  Filled 2016-01-29: qty 1

## 2016-01-29 NOTE — Discharge Instructions (Signed)
Muscle Cramps and Spasms Muscle cramps and spasms are when muscles tighten by themselves. They usually get better within minutes. Muscle cramps are painful. They are usually stronger and last longer than muscle spasms. Muscle spasms may or may not be painful. They can last a few seconds or much longer. HOME CARE  Drink enough fluid to keep your pee (urine) clear or pale yellow.  Massage, stretch, and relax the muscle.  Use a warm towel, heating pad, or warm shower water on tight muscles.  Place ice on the muscle if it is tender or in pain.  Put ice in a plastic bag.  Place a towel between your skin and the bag.  Leave the ice on for 15-20 minutes, 03-04 times a day.  Only take medicine as told by your doctor. GET HELP RIGHT AWAY IF:  Your cramps or spasms get worse, happen more often, or do not get better with time. MAKE SURE YOU:  Understand these instructions.  Will watch your condition.  Will get help right away if you are not doing well or get worse.   This information is not intended to replace advice given to you by your health care provider. Make sure you discuss any questions you have with your health care provider.   Document Released: 09/25/2008 Document Revised: 02/07/2013 Document Reviewed: 09/29/2012 Elsevier Interactive Patient Education 2016 [employer]  

## 2016-01-29 NOTE — ED Notes (Signed)
MD at bedside. 

## 2016-01-29 NOTE — ED Notes (Signed)
PT able to ambulate to the restroom without assistance or difficulty. Nurse tech stood by for safety.

## 2016-01-29 NOTE — ED Provider Notes (Signed)
CSN: [id number]     Arrival date & time 01/28/16  2335 History   First MD Initiated Contact with Patient 01/29/16 0013     Chief Complaint  Patient presents with  . Leg Pain     (Consider location/radiation/quality/duration/timing/severity/associated sxs/prior Treatment) HPI Comments: Pt here with bilateral upper thigh pain that started 3 days ago when she started her new job She stands for several hours and doesn't experience sx when she is not working Pain is crampy in her thighs and better w/ rest--denies leg edema No back pain or radicular sx No numbness/tingling to her legs No associated chest pain/sob No tx used tonight pta  Patient is a 50 y.o. female presenting with leg pain. The history is provided by the patient and a relative.  Leg Pain   Past Medical History  Diagnosis Date  . Hyperlipidemia   . Heartburn   . Gastric ulcer    No past surgical history on file. No family history on file. Social History  Substance Use Topics  . Smoking status: Never Smoker   . Smokeless tobacco: Not on file  . Alcohol Use: No   OB History    No data available     Review of Systems  All other systems reviewed and are negative.     Allergies  Aspirin  Home Medications   Prior to Admission medications   Medication Sig Start Date End Date Taking? Authorizing Provider  cephALEXin (KEFLEX) 500 MG capsule Take 1 capsule (500 mg total) by mouth 4 (four) times daily. 11/14/15   Danelle BerryLeisa Tapia, PA-C  pantoprazole (PROTONIX) 20 MG tablet Take 1 tablet (20 mg total) by mouth daily. 11/14/15   Danelle BerryLeisa Tapia, PA-C  polyethylene glycol powder (GLYCOLAX/MIRALAX) powder Take 17 g by mouth 2 (two) times daily. 11/14/15   Danelle BerryLeisa Tapia, PA-C   BP 115/73 mmHg  Pulse 76  Temp(Src) 97.9 F (36.6 C) (Oral)  Resp 16  SpO2 99% Physical Exam  Constitutional: She is oriented to person, place, and time. She appears well-developed and well-nourished.  Non-toxic appearance. No distress.  HENT:   Head: Normocephalic and atraumatic.  Eyes: Conjunctivae, EOM and lids are normal. Pupils are equal, round, and reactive to light.  Neck: Normal range of motion. Neck supple. No tracheal deviation present. No thyroid mass present.  Cardiovascular: Normal rate, regular rhythm and normal heart sounds.  Exam reveals no gallop.   No murmur heard. Pulmonary/Chest: Effort normal and breath sounds normal. No stridor. No respiratory distress. She has no decreased breath sounds. She has no wheezes. She has no rhonchi. She has no rales.  Abdominal: Soft. Normal appearance and bowel sounds are normal. She exhibits no distension. There is no tenderness. There is no rebound and no CVA tenderness.  Musculoskeletal: Normal range of motion. She exhibits no edema or tenderness.  Neurological: She is alert and oriented to person, place, and time. She has normal strength. No cranial nerve deficit or sensory deficit. GCS eye subscore is 4. GCS verbal subscore is 5. GCS motor subscore is 6.  Skin: Skin is warm and dry. No abrasion and no rash noted.  Psychiatric: She has a normal mood and affect. Her speech is normal and behavior is normal.  Nursing note and vitals reviewed.   ED Course  Procedures (including critical care time) Labs Review Labs Reviewed  COMPREHENSIVE METABOLIC PANEL    Imaging Review No results found. I have personally reviewed and evaluated these images and lab results as part of my medical  decision-making.   EKG Interpretation None      MDM   Final diagnoses:  None    Patient given Valium and feels better. We'll placed on Robaxin.    Lorre Nick, MD 01/29/16 [phone]

## 2016-01-30 NOTE — ED Notes (Signed)
Pt's chart accessed to print a note.

## 2016-12-27 ENCOUNTER — Encounter (HOSPITAL_COMMUNITY): Payer: Self-pay | Admitting: Emergency Medicine

## 2016-12-27 ENCOUNTER — Emergency Department (HOSPITAL_COMMUNITY): Payer: Self-pay

## 2016-12-27 ENCOUNTER — Emergency Department (HOSPITAL_COMMUNITY)
Admission: EM | Admit: 2016-12-27 | Discharge: 2016-12-27 | Disposition: A | Discharge status: Home / Self Care | Payer: Self-pay | Attending: Emergency Medicine | Admitting: Emergency Medicine

## 2016-12-27 DIAGNOSIS — R1013 Epigastric pain: Secondary | ICD-10-CM | POA: Insufficient documentation

## 2016-12-27 DIAGNOSIS — K219 Gastro-esophageal reflux disease without esophagitis: Secondary | ICD-10-CM | POA: Insufficient documentation

## 2016-12-27 LAB — BASIC METABOLIC PANEL
Anion gap: 8 (ref 5–15)
BUN: 13 mg/dL (ref 6–20)
CO2: 26 mmol/L (ref 22–32)
CREATININE: 0.92 mg/dL (ref 0.44–1.00)
Calcium: 10 mg/dL (ref 8.9–10.3)
Chloride: 100 mmol/L — ABNORMAL LOW (ref 101–111)
Glucose, Bld: 129 mg/dL — ABNORMAL HIGH (ref 65–99)
Potassium: 3.7 mmol/L (ref 3.5–5.1)
SODIUM: 134 mmol/L — AB (ref 135–145)

## 2016-12-27 LAB — I-STAT TROPONIN, ED
TROPONIN I, POC: 0 ng/mL (ref 0.00–0.08)
Troponin i, poc: 0 ng/mL (ref 0.00–0.08)

## 2016-12-27 LAB — HEPATIC FUNCTION PANEL
ALT: 17 U/L (ref 14–54)
AST: 29 U/L (ref 15–41)
Albumin: 4.3 g/dL (ref 3.5–5.0)
Alkaline Phosphatase: 59 U/L (ref 38–126)
Bilirubin, Direct: <0.1 mg/dL — ABNORMAL LOW (ref 0.1–0.5)
Total Bilirubin: 0.5 mg/dL (ref 0.3–1.2)
Total Protein: 8.5 g/dL — ABNORMAL HIGH (ref 6.5–8.1)

## 2016-12-27 LAB — CBC
HCT: 37 % (ref 36.0–46.0)
Hemoglobin: 12.5 g/dL (ref 12.0–15.0)
MCH: 30 pg (ref 26.0–34.0)
MCHC: 33.8 g/dL (ref 30.0–36.0)
MCV: 88.9 fL (ref 78.0–100.0)
PLATELETS: 256 10*3/uL (ref 150–400)
RBC: 4.16 MIL/uL (ref 3.87–5.11)
RDW: 13.6 % (ref 11.5–15.5)
WBC: 6.5 10*3/uL (ref 4.0–10.5)

## 2016-12-27 LAB — LIPASE, BLOOD: LIPASE: 26 U/L (ref 11–51)

## 2016-12-27 MED ORDER — RANITIDINE HCL 150 MG PO TABS: 150.0000 mg | ORAL_TABLET | Freq: Two times a day (BID) | ORAL | 0 refills | Status: DC

## 2016-12-27 MED ORDER — GI COCKTAIL ~~LOC~~
30.0000 mL | Freq: Once | ORAL | Status: AC
  Administered 2016-12-27: 30 mL via ORAL
  Filled 2016-12-27: qty 30

## 2016-12-27 MED ORDER — SUCRALFATE 1 GM/10ML PO SUSP: 1.0000 g | Freq: Three times a day (TID) | ORAL | 0 refills | Status: DC

## 2016-12-27 MED ORDER — OMEPRAZOLE 20 MG PO CPDR: 40.0000 mg | DELAYED_RELEASE_CAPSULE | Freq: Every day | ORAL | 0 refills | Status: DC

## 2016-12-27 MED ORDER — ONDANSETRON 4 MG PO TBDP
4.0000 mg | ORAL_TABLET | Freq: Once | ORAL | Status: AC
  Administered 2016-12-27: 4 mg via ORAL
  Filled 2016-12-27: qty 1

## 2016-12-27 NOTE — ED Triage Notes (Signed)
Pt c/o abdominal burning pain that radiates up to chest area. Pt reports has heartburn. Pain occurs with vomiting every time she eats.

## 2016-12-27 NOTE — Discharge Instructions (Signed)
The workup we did today was normal. Given your symptoms and the physical exam we did today I suspect that you're having issues controlling your reflux. You have been prescribed 3 medicines to help you with your symptoms. Please take these medications as prescribed. It is very important that you control and modifying your diet avoiding spicy food, greasy food, coffee and anything containing tomatoes. It is important that you follow-up with a gastroenterologist as you will likely need further testing to rule out other causes of your worsening reflux symptoms. They may suggest endoscopy to visualize your esophagus in her stomach.

## 2016-12-27 NOTE — ED Provider Notes (Signed)
[facility]-EMERGENCY DEPT Provider Note   CSN: [id number] Arrival date & time: 12/27/16  1041     History   Chief Complaint Chief Complaint  Patient presents with  . Chest Pain  . Abdominal Pain    HPI Shelby Vargas is a 51 y.o. female with pertinent past medical history of heartburn, hyperlipidemia, obesity, gastric ulcer presents to the emergency department reporting epigastric, burning, intermittent abdominal pain that radiates to her chest and to her back between her shoulder blades after every meal. Associated symptoms include nausea, vomiting, sour taste in the back of her mouth. Aggravating factors include eating, laying supine and palpation of her epigastric region. Patient states that she has been throwing up every meal for the past week, has been tolerating water without issues. Patient is taking Tagamet and Tums for symptoms which provided no relief. Patient tells me she was told she had a gastric ulcer many years ago, unable to tell me the exact date. She denies exertional chest pain or shortness of breath, fevers, lightheadedness, diarrhea, dark/black stools.  No EtOH abuse, no NSAID use. Patient states she does eat a lot of spicy food and coffee.  HPI  Past Medical History:  Diagnosis Date  . Gastric ulcer   . Heartburn   . Hyperlipidemia     There are no active problems to display for this patient.   History reviewed. No pertinent surgical history.  OB History    No data available       Home Medications    Prior to Admission medications   Medication Sig Start Date End Date Taking? Authorizing Provider  cimetidine (TAGAMET HB) 200 MG tablet Take 200 mg by mouth daily as needed (heartburn).    Yes Historical Provider, MD  omeprazole (PRILOSEC) 20 MG capsule Take 2 capsules (40 mg total) by mouth daily. 12/27/16   Liberty Handylaudia J Tavon Corriher, PA-C  ranitidine (ZANTAC) 150 MG tablet Take 1 tablet (150 mg total) by mouth 2 (two) times daily. 12/27/16   Liberty Handylaudia J Cordaryl Decelles, PA-C    sucralfate (CARAFATE) 1 GM/10ML suspension Take 10 mLs (1 g total) by mouth 4 (four) times daily -  with meals and at bedtime. 12/27/16   Liberty Handylaudia J Taiquan Campanaro, PA-C    Family History No family history on file.  Social History Social History  Substance Use Topics  . Smoking status: Never Smoker  . Smokeless tobacco: Never Used  . Alcohol use No     Allergies   Aspirin   Review of Systems Review of Systems  Constitutional: Negative for appetite change, chills, diaphoresis, fever and unexpected weight change.  HENT: Negative for sore throat.   Eyes: Negative for visual disturbance.  Respiratory: Negative for cough, choking, chest tightness and shortness of breath.   Cardiovascular: Positive for chest pain. Negative for palpitations and leg swelling.  Gastrointestinal: Positive for abdominal pain, nausea and vomiting. Negative for constipation and diarrhea.  Genitourinary: Negative for difficulty urinating, dysuria, hematuria and pelvic pain.  Musculoskeletal: Negative for arthralgias.  Skin: Negative for color change and rash.  Neurological: Negative for dizziness, syncope, weakness, light-headedness and headaches.  Hematological: Does not bruise/bleed easily.  Psychiatric/Behavioral: Negative.      Physical Exam Updated Vital Signs BP 99/67   Pulse 63   Temp 97.7 F (36.5 C) (Oral)   Resp 11   Ht 5\' 7"  (1.702 m)   Wt 103 kg   SpO2 100%   BMI 35.55 kg/m   Physical Exam  Constitutional: She is oriented to person,  place, and time. She appears well-developed and well-nourished. No distress.  HENT:  Head: Normocephalic and atraumatic.  Nose: Nose normal.  Mouth/Throat: Oropharynx is clear and moist. No oropharyngeal exudate.  Moist mucous membranes No posterior oropharynx erythema or cobblestoning.  Eyes: Conjunctivae and EOM are normal. Pupils are equal, round, and reactive to light.  Neck: Normal range of motion. Neck supple. No JVD present.  Cardiovascular: Normal  rate, regular rhythm, normal heart sounds and intact distal pulses.   No murmur heard. Pulmonary/Chest: Effort normal and breath sounds normal. No respiratory distress. She has no wheezes. She has no rales. She exhibits no tenderness.  Abdominal: Soft. Bowel sounds are normal. She exhibits no distension and no mass. There is tenderness. There is no rebound and no guarding.  Abdomen is soft with epigastric and RUQ tenderness to deep palpation without distention, rigidity, guarding or rebound.  No surgical abdominal scars noted.  No pulsating masses.  + Bowel sounds throughout.  No CVAT.  Negative Murphy's. Negative McBurney's.  Non palpable kidneys. No hepatosplenomegaly.   Musculoskeletal: Normal range of motion. She exhibits no deformity.  Lymphadenopathy:    She has no cervical adenopathy.  Neurological: She is alert and oriented to person, place, and time. No sensory deficit.  Skin: Skin is warm and dry. Capillary refill takes less than 2 seconds.  Psychiatric: She has a normal mood and affect. Her behavior is normal. Judgment and thought content normal.  Nursing note and vitals reviewed.    ED Treatments / Results  Labs (all labs ordered are listed, but only abnormal results are displayed) Labs Reviewed  BASIC METABOLIC PANEL - Abnormal; Notable for the following:       Result Value   Sodium 134 (*)    Chloride 100 (*)    Glucose, Bld 129 (*)    All other components within normal limits  CBC  HEPATIC FUNCTION PANEL  LIPASE, BLOOD  I-STAT TROPOININ, ED  I-STAT TROPOININ, ED    EKG  EKG Interpretation None       Radiology Dg Chest 2 View  Result Date: 12/27/2016 CLINICAL DATA:  epigastric pain radiating to chest EXAM: CHEST  2 VIEW COMPARISON:  11/13/2015 FINDINGS: The heart size and mediastinal contours are within normal limits. Both lungs are clear. The visualized skeletal structures are unremarkable. IMPRESSION: No active cardiopulmonary disease. Electronically  Signed   By: Norva Pavlov M.D.   On: 12/27/2016 14:16    Procedures Procedures (including critical care time)  Medications Ordered in ED Medications  gi cocktail (Maalox,Lidocaine,Donnatal) (30 mLs Oral Given 12/27/16 1338)  ondansetron (ZOFRAN-ODT) disintegrating tablet 4 mg (4 mg Oral Given 12/27/16 1337)     Initial Impression / Assessment and Plan / ED Course  I have reviewed the triage vital signs and the nursing notes.  Pertinent labs & imaging results that were available during my care of the patient were reviewed by me and considered in my medical decision making (see chart for details).  Clinical Course as of Dec 28 1530  Sat Dec 27, 2016  1432 Temp: 97.7 F (36.5 C) [CG]  1432 Pulse Rate: 78 [CG]  1432 BP: 108/75 [CG]  1432 Resp: 13 [CG]  1432 SpO2: 99 % [CG]  1433 Hemoglobin: 12.5 [CG]  1453 Troponin i, poc: 0.00 [CG]    Clinical Course User Index [CG] Liberty Handy, PA-C    51 year old female with pertinent past medical history of GERD, gastric ulcer ("many years ago"), hyperlipidemia, obesity presents to the emergency  department reporting epigastric abdominal pain that is intermittent and worse after meals that radiates to her chest.  Associated symptoms include sour taste in the back of her mouth, nausea and vomiting of "sour stuff". Patient has a long history of reflux, states that in the past week this has worsened and has been vomiting after every meal. Patient is tolerating water without difficulty. No red flag symptoms including exertional chest pain or shortness of breath, fevers, hoarseness, recent unintentional weight loss, hematemesis, dark/black stools. Patient is taking Tagamet which has not provided relief. Abdominal exam is remarkable for mild epigastric and right upper quadrant tenderness with deep palpation otherwise abdomen is soft, without rigidity, rebound or distention. RRR, lungs clear to auscultation. Patient does not look dry. Vital signs are  reassuring. Initial differential diagnoses includes worsening GERD, PUD and less likely ACS. EKG, chest x-ray, troponin are all within normal limits. Total troponin normal. CMP, lipase and CBC are within normal limits. Patient received GI cocktail and Zofran in the ED, followed by by mouth challenge which was successful. Patient reported improvement and abdominal pain and was able to tolerate fluids in the ED without exacerbation of her symptoms. Repeat abdominal exam is unchanged, abdomen is soft, nondistended without rigidity or distention. Given reassuring ED workup today suspect patient needs better control of her GERD. Patient will be discharged at this time with PPI, Zantac, Carafate and GI follow-up.  Patient, ED treatment and discharge plan was discussed with supervising physician who is agreeable with plan.    Final Clinical Impressions(s) / ED Diagnoses   Final diagnoses:  Epigastric abdominal pain  Chronic GERD    New Prescriptions Discharge Medication List as of 12/27/2016  3:00 PM    START taking these medications   Details  omeprazole (PRILOSEC) 20 MG capsule Take 2 capsules (40 mg total) by mouth daily., Starting Sat 12/27/2016, Print    ranitidine (ZANTAC) 150 MG tablet Take 1 tablet (150 mg total) by mouth 2 (two) times daily., Starting Sat 12/27/2016, Print    sucralfate (CARAFATE) 1 GM/10ML suspension Take 10 mLs (1 g total) by mouth 4 (four) times daily -  with meals and at bedtime., Starting Sat 12/27/2016, Print         Liberty Handy, PA-C 12/27/16 1607    Shaune Pollack, MD 12/27/16 [phone]

## 2016-12-27 NOTE — ED Notes (Signed)
Patient transported to X-ray 

## 2016-12-27 NOTE — ED Notes (Signed)
ED Provider at bedside. 

## 2019-01-07 ENCOUNTER — Emergency Department (HOSPITAL_COMMUNITY)
Admission: EM | Admit: 2019-01-07 | Discharge: 2019-01-08 | Disposition: A | Discharge status: Home / Self Care | Payer: Self-pay | Attending: Emergency Medicine | Admitting: Emergency Medicine

## 2019-01-07 ENCOUNTER — Other Ambulatory Visit: Payer: Self-pay

## 2019-01-07 ENCOUNTER — Encounter (HOSPITAL_COMMUNITY): Payer: Self-pay | Admitting: Emergency Medicine

## 2019-01-07 DIAGNOSIS — R51 Headache: Secondary | ICD-10-CM | POA: Insufficient documentation

## 2019-01-07 DIAGNOSIS — M7918 Myalgia, other site: Secondary | ICD-10-CM | POA: Insufficient documentation

## 2019-01-07 DIAGNOSIS — W19XXXA Unspecified fall, initial encounter: Secondary | ICD-10-CM

## 2019-01-07 DIAGNOSIS — W010XXA Fall on same level from slipping, tripping and stumbling without subsequent striking against object, initial encounter: Secondary | ICD-10-CM | POA: Insufficient documentation

## 2019-01-07 DIAGNOSIS — Z79899 Other long term (current) drug therapy: Secondary | ICD-10-CM | POA: Insufficient documentation

## 2019-01-07 NOTE — ED Notes (Signed)
Bed: EF00 Expected date:  Expected time:  Means of arrival:  Comments: 24F fall, back pain

## 2019-01-07 NOTE — ED Triage Notes (Signed)
Patient brought in by [facility] [place]. Patient is complaining of back pain. No LOC.

## 2019-01-08 ENCOUNTER — Emergency Department (HOSPITAL_COMMUNITY): Payer: Self-pay

## 2019-01-08 MED ORDER — PANTOPRAZOLE SODIUM 20 MG PO TBEC: 20.0000 mg | DELAYED_RELEASE_TABLET | Freq: Every day | ORAL | 0 refills | Status: AC

## 2019-01-08 MED ORDER — HYDROCODONE-ACETAMINOPHEN 5-325 MG PO TABS: 1.0000 | ORAL_TABLET | Freq: Four times a day (QID) | ORAL | 0 refills | Status: AC | PRN

## 2019-01-08 MED ORDER — HYDROCODONE-ACETAMINOPHEN 5-325 MG PO TABS
2.0000 | ORAL_TABLET | Freq: Once | ORAL | Status: AC
  Administered 2019-01-08: 2 via ORAL
  Filled 2019-01-08: qty 2

## 2019-01-08 NOTE — ED Provider Notes (Signed)
[facility]-EMERGENCY DEPT Provider Note   CSN: [id number] Arrival date & time: 01/07/19  2351    History   Chief Complaint Chief Complaint  Patient presents with   Back Pain    HPI Shelby Vargas is a 53 y.o. female.     HPI 53 year old female with past medical history of gastric ulcer here with fall.  The patient was in her usual state of health.  She was at work today when she slipped, falling onto her right shoulder, hip, and back.  She reports immediate onset of severe, 10 of 10, aching, throbbing, right shoulder, lower midline back, and right hip pain.  Denies any numbness or weakness shooting down the arms or legs.  She did not hit her head, but did have some mild dizziness afterwards.  She has some mild right paraspinal neck pain as well.  No upper extremity tingling or paresthesias.  She was well prior to the fall.  She is not on blood thinners.  No abdominal pain.  No chest pain or shortness of breath.   Past Medical History:  Diagnosis Date   Gastric ulcer    Heartburn    Hyperlipidemia     There are no active problems to display for this patient.   History reviewed. No pertinent surgical history.   OB History   No obstetric history on file.      Home Medications    Prior to Admission medications   Medication Sig Start Date End Date Taking? Authorizing Provider  Multiple Vitamin (MULTIVITAMIN WITH MINERALS) TABS tablet Take 1 tablet by mouth daily.   Yes [provider]  omeprazole (PRILOSEC OTC) 20 MG tablet Take 20 mg by mouth daily as needed (for acid reflex).   Yes [provider]  HYDROcodone-acetaminophen (NORCO/VICODIN) 5-325 MG tablet Take 1-2 tablets by mouth every 6 (six) hours as needed for moderate pain or severe pain. 01/08/19   Shaune Pollack, MD  pantoprazole (PROTONIX) 20 MG tablet Take 1 tablet (20 mg total) by mouth daily for 30 days. 01/08/19 02/07/19  Shaune Pollack, MD    Family  History History reviewed. No pertinent family history.  Social History Social History   Tobacco Use   Smoking status: Never Smoker   Smokeless tobacco: Never Used  Substance Use Topics   Alcohol use: No   Drug use: No     Allergies   Aspirin   Review of Systems Review of Systems  Constitutional: Negative for chills and fever.  HENT: Negative for congestion, rhinorrhea and sore throat.   Eyes: Negative for visual disturbance.  Respiratory: Negative for cough, shortness of breath and wheezing.   Cardiovascular: Negative for chest pain and leg swelling.  Gastrointestinal: Negative for abdominal pain, diarrhea, nausea and vomiting.  Genitourinary: Negative for dysuria, flank pain, vaginal bleeding and vaginal discharge.  Musculoskeletal: Positive for arthralgias, back pain and myalgias. Negative for neck pain.  Skin: Negative for rash.  Allergic/Immunologic: Negative for immunocompromised state.  Neurological: Positive for headaches. Negative for syncope.  Hematological: Does not bruise/bleed easily.  All other systems reviewed and are negative.    Physical Exam Updated Vital Signs BP 116/84    Pulse (!) 59    Temp 97.8 F (36.6 C) (Oral)    Resp (!) 21    Ht 5\' 8"  (1.727 m)    Wt 102.1 kg    SpO2 100%    BMI 34.21 kg/m   Physical Exam Vitals signs and nursing note reviewed.  Constitutional:  General: She is not in acute distress.    Appearance: She is well-developed.  HENT:     Head: Normocephalic and atraumatic.  Eyes:     Conjunctiva/sclera: Conjunctivae normal.  Neck:     Musculoskeletal: Neck supple.     Comments: Mild right paraspinal tenderness.  No midline tenderness.  Minimal pain on range of motion. Cardiovascular:     Rate and Rhythm: Normal rate and regular rhythm.     Heart sounds: Normal heart sounds. No murmur. No friction rub.  Pulmonary:     Effort: Pulmonary effort is normal. No respiratory distress.     Breath sounds: Normal breath  sounds. No wheezing or rales.  Abdominal:     General: There is no distension.     Palpations: Abdomen is soft.     Tenderness: There is no abdominal tenderness.  Musculoskeletal:     Comments: Tenderness to palpation over the right lateral shoulder.  No bruising or deformity.  Mild midline tenderness across lower lumbar spine with no bruising or deformity.  Tenderness over the right greater trochanter.  Skin:    General: Skin is warm.     Capillary Refill: Capillary refill takes less than 2 seconds.  Neurological:     Mental Status: She is alert and oriented to person, place, and time.     Motor: No abnormal muscle tone.     Comments: Strength 5 and 5 bilateral upper and lower extremities.  Cranial nerves intact.  Normal strength sensation throughout bilateral upper and lower extremities.      ED Treatments / Results  Labs (all labs ordered are listed, but only abnormal results are displayed) Labs Reviewed - No data to display  EKG None  Radiology Dg Chest 2 View  Result Date: 01/08/2019 CLINICAL DATA:  Slip and fall injury. Right shoulder and chest pain. EXAM: CHEST - 2 VIEW COMPARISON:  12/27/2016 FINDINGS: Shallow inspiration. The heart size and mediastinal contours are within normal limits. Both lungs are clear. The visualized skeletal structures are unremarkable. IMPRESSION: No active cardiopulmonary disease. Electronically Signed   By: Burman Nieves M.D.   On: 01/08/2019 01:33   Dg Shoulder Right  Result Date: 01/08/2019 CLINICAL DATA:  Right shoulder pain after slip and fall injury. EXAM: RIGHT SHOULDER - 2+ VIEW COMPARISON:  None. FINDINGS: Degenerative changes in the right shoulder with joint space narrowing and hypertrophic changes involving the glenohumeral and acromioclavicular joints. Subcortical cysts in the lateral right humeral head. Subacromial spurring is present. No evidence of acute fracture or dislocation of the right shoulder. No focal bone lesion or bone  destruction. Soft tissues are unremarkable. IMPRESSION: Degenerative changes in the right shoulder. No acute bony abnormalities. Electronically Signed   By: Burman Nieves M.D.   On: 01/08/2019 01:34   Ct Head Wo Contrast  Result Date: 01/08/2019 CLINICAL DATA:  Initial evaluation for acute headache, fall. EXAM: CT HEAD WITHOUT CONTRAST CT CERVICAL SPINE WITHOUT CONTRAST TECHNIQUE: Multidetector CT imaging of the head and cervical spine was performed following the standard protocol without intravenous contrast. Multiplanar CT image reconstructions of the cervical spine were also generated. COMPARISON:  None. FINDINGS: CT HEAD FINDINGS Brain: Examination degraded by motion artifact. Cerebral volume within normal limits for patient age. No evidence for acute intracranial hemorrhage. No findings to suggest acute large vessel territory infarct. No mass lesion, midline shift, or mass effect. Ventricles are normal in size without evidence for hydrocephalus. No extra-axial fluid collection identified. Vascular: No hyperdense vessel identified. Skull: Scalp  soft tissues demonstrate no acute abnormality. Calvarium intact. Sinuses/Orbits: Globes and orbital soft tissues within normal limits. Visualized paranasal sinuses are clear. No mastoid effusion. CT CERVICAL SPINE FINDINGS Alignment: Straightening of the normal cervical lordosis. No listhesis or malalignment. Skull base and vertebrae: Skull base intact. Normal C1-2 articulations are preserved in the dens is intact. Vertebral body heights maintained. No acute fracture. Soft tissues and spinal canal: Soft tissues of the neck demonstrate no acute finding. No abnormal prevertebral edema. Spinal canal within normal limits. Disc levels: No significant disc pathology within the cervical spine. Upper chest: Visualized upper chest demonstrates no acute finding. Visualized lung apices are clear. Other: None. IMPRESSION: CT BRAIN: Negative head CT.  No acute intracranial  abnormality. CT CERVICAL SPINE: No acute traumatic injury within the cervical spine. Electronically Signed   By: Rise Mu M.D.   On: 01/08/2019 02:18   Ct Cervical Spine Wo Contrast  Result Date: 01/08/2019 CLINICAL DATA:  Initial evaluation for acute headache, fall. EXAM: CT HEAD WITHOUT CONTRAST CT CERVICAL SPINE WITHOUT CONTRAST TECHNIQUE: Multidetector CT imaging of the head and cervical spine was performed following the standard protocol without intravenous contrast. Multiplanar CT image reconstructions of the cervical spine were also generated. COMPARISON:  None. FINDINGS: CT HEAD FINDINGS Brain: Examination degraded by motion artifact. Cerebral volume within normal limits for patient age. No evidence for acute intracranial hemorrhage. No findings to suggest acute large vessel territory infarct. No mass lesion, midline shift, or mass effect. Ventricles are normal in size without evidence for hydrocephalus. No extra-axial fluid collection identified. Vascular: No hyperdense vessel identified. Skull: Scalp soft tissues demonstrate no acute abnormality. Calvarium intact. Sinuses/Orbits: Globes and orbital soft tissues within normal limits. Visualized paranasal sinuses are clear. No mastoid effusion. CT CERVICAL SPINE FINDINGS Alignment: Straightening of the normal cervical lordosis. No listhesis or malalignment. Skull base and vertebrae: Skull base intact. Normal C1-2 articulations are preserved in the dens is intact. Vertebral body heights maintained. No acute fracture. Soft tissues and spinal canal: Soft tissues of the neck demonstrate no acute finding. No abnormal prevertebral edema. Spinal canal within normal limits. Disc levels: No significant disc pathology within the cervical spine. Upper chest: Visualized upper chest demonstrates no acute finding. Visualized lung apices are clear. Other: None. IMPRESSION: CT BRAIN: Negative head CT.  No acute intracranial abnormality. CT CERVICAL SPINE: No  acute traumatic injury within the cervical spine. Electronically Signed   By: Rise Mu M.D.   On: 01/08/2019 02:18   Ct Lumbar Spine Wo Contrast  Result Date: 01/08/2019 CLINICAL DATA:  Initial evaluation for acute back pain status post fall. EXAM: CT LUMBAR SPINE WITHOUT CONTRAST TECHNIQUE: Multidetector CT imaging of the lumbar spine was performed without intravenous contrast administration. Multiplanar CT image reconstructions were also generated. COMPARISON:  None. FINDINGS: Segmentation: Standard. Lowest well-formed disc labeled the L5-S1 level. Alignment: 2 mm retrolisthesis of L3 on L4, with 3 mm anterolisthesis of L4 on L5, likely chronic and facet mediated. Vertebrae: Vertebral body heights maintained without evidence for acute or chronic fracture. Chronic degenerative endplate changes noted at the inferior endplate of L3. Visualized sacrum and pelvis intact. SI joints approximated symmetric. No discrete osseous lesions. Paraspinal and other soft tissues: Paraspinous soft tissues demonstrate no acute finding. Aortic atherosclerosis noted. Visualized visceral structures unremarkable. Disc levels: L1-2:  Unremarkable. L2-3:  Mild facet hypertrophy, greater on the right.  No stenosis. L3-4: Trace retrolisthesis. Diffuse disc bulge with disc desiccation and intervertebral disc space narrowing. Mild bilateral facet hypertrophy. Resultant  mild spinal stenosis. Mild to moderate bilateral L3 foraminal narrowing. L4-5: Mild diffuse disc bulge. Moderate facet hypertrophy. Resultant severe spinal stenosis. Moderate right with severe left L4 foraminal narrowing. L5-S1: Anterolisthesis. Mild diffuse disc bulge. Advanced bilateral facet arthrosis. Resultant moderate canal with bilateral lateral recess narrowing. Moderate left L5 foraminal stenosis. IMPRESSION: 1. No acute traumatic injury within the lumbar spine. 2. 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 3. Trace chronic facet mediated anterolisthesis of L5 on S1 with resultant moderate left L5 foraminal narrowing. Electronically Signed   By: Rise Mu M.D.   On: 01/08/2019 01:42   Dg Hip Unilat W Or Wo Pelvis 2-3 Views Right  Result Date: 01/08/2019 CLINICAL DATA:  Right hip pain after trip and fall injury. EXAM: DG HIP (WITH OR WITHOUT PELVIS) 2-3V RIGHT COMPARISON:  None. FINDINGS: Degenerative changes in the right hip. No evidence of acute fracture or dislocation. No focal bone lesion or bone destruction. Bone cortex appears intact. Pelvis appears intact. SI joints and symphysis pubis are not displaced. Soft tissue calcifications projected over the hips consistent with injection granulomas. Visualized sacrum appears intact. IMPRESSION: Degenerative changes in the right hip. No acute bony abnormalities. Electronically Signed   By: Burman Nieves M.D.   On: 01/08/2019 01:36    Procedures Procedures (including critical care time)  Medications Ordered in ED Medications  HYDROcodone-acetaminophen (NORCO/VICODIN) 5-325 MG per tablet 2 tablet (2 tablets Oral Given 01/08/19 0138)     Initial Impression / Assessment and Plan / ED Course  I have reviewed the triage vital signs and the nursing notes.  Pertinent labs & imaging results that were available during my care of the patient were reviewed by me and considered in my medical decision making (see chart for details).       53 year old female here with pain after mechanical fall.  There is no loss of consciousness.  She is on blood thinners.  Imaging negative for any acute fracture.  She has no upper extremity numbness, weakness, paresthesias, or evidence suggest central cord or acute foraminal injury.  She feels markedly improved with analgesics here.  Will discharge on outpatient pain meds, as well as refill her antacids per her request.  Abdomen is soft and nontender.  No sign  of significant thoracic or abdominal trauma.  Return precautions given.  Final Clinical Impressions(s) / ED Diagnoses   Final diagnoses:  Fall, initial encounter  Musculoskeletal pain    ED Discharge Orders         Ordered    HYDROcodone-acetaminophen (NORCO/VICODIN) 5-325 MG tablet  Every 6 hours PRN     01/08/19 0242    pantoprazole (PROTONIX) 20 MG tablet  Daily     01/08/19 0242           Shaune Pollack, MD 01/08/19 [phone]

## 2019-10-14 IMAGING — CT CT LUMBAR SPINE WITHOUT CONTRAST
3 of 4 series · 13 of 33 positions shown, 16 images · non-contrast
Comparison: None.

CLINICAL DATA: Initial evaluation for acute back pain status post
fall.

EXAM:
CT LUMBAR SPINE WITHOUT CONTRAST
TECHNIQUE: Multidetector CT imaging of the lumbar spine was performed without
intravenous contrast administration. Multiplanar CT image
reconstructions were also generated.

[Series 4: l spine st · axial · 0.30mm/px · z∈[+800,+950]mm · 5 of 113 slices shown, 7 images]
[im 19/113  soft-tissue]
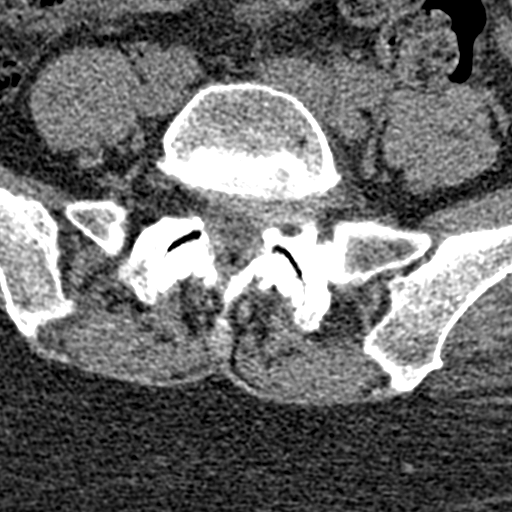
[im 19/113  bone]
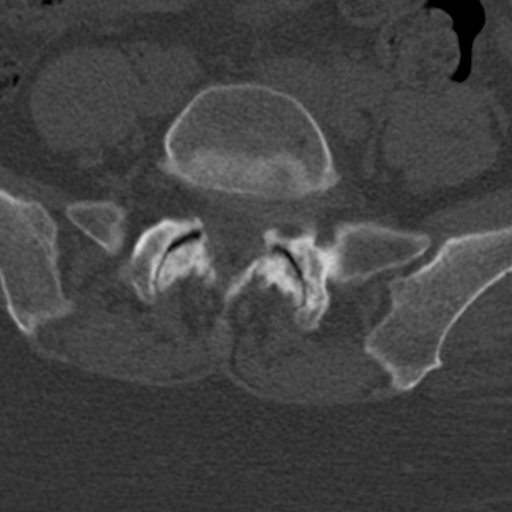
[im 38/113  bone]
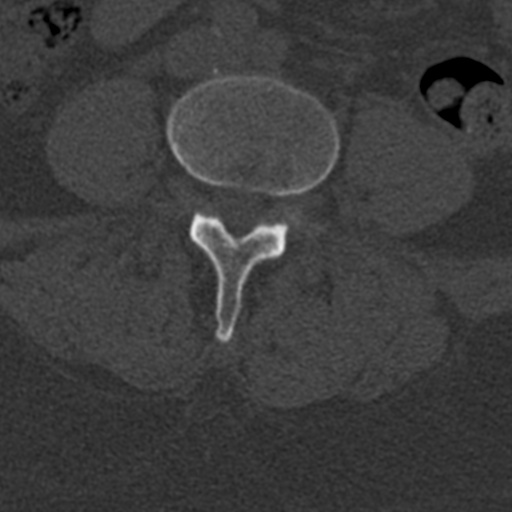
[im 57/113  bone]
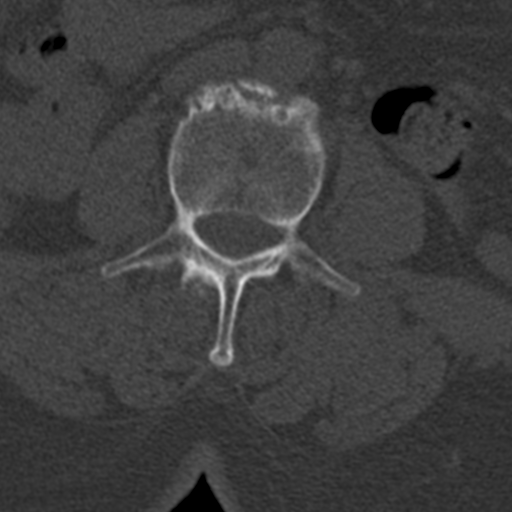
[im 75/113  bone]
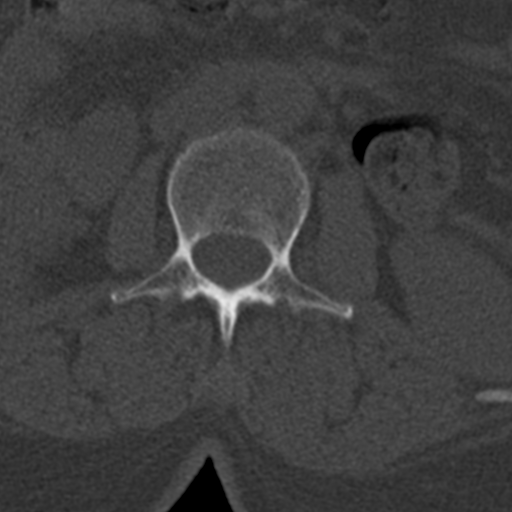
[im 94/113  soft-tissue]
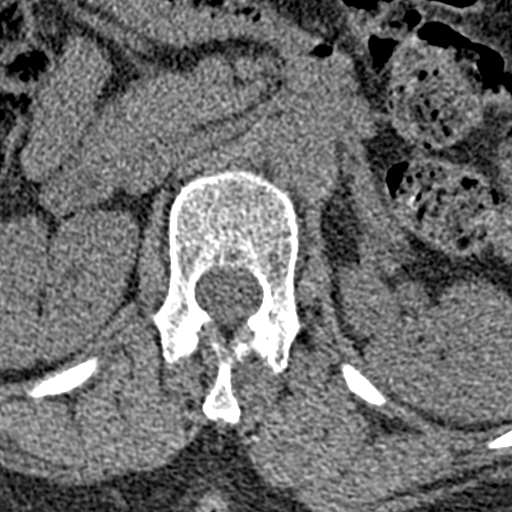
[im 94/113  bone]
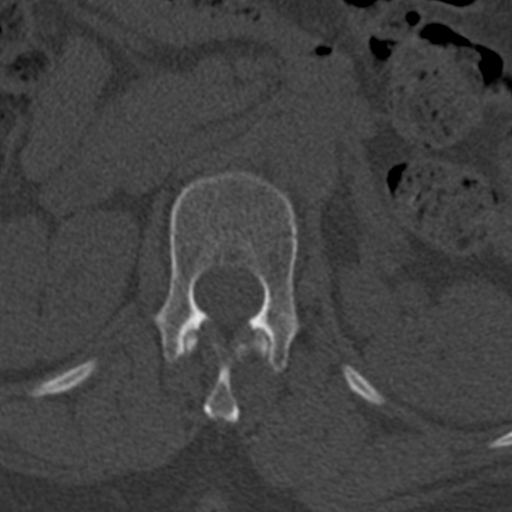

[Series 8: coronal bone · coronal · 0.33mm/px · 3 of 76 slices shown]
[im 16/76  bone]
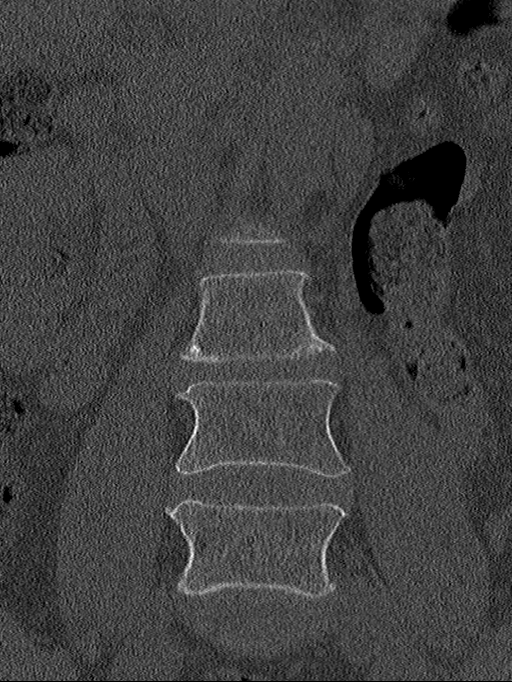
[im 31/76  bone]
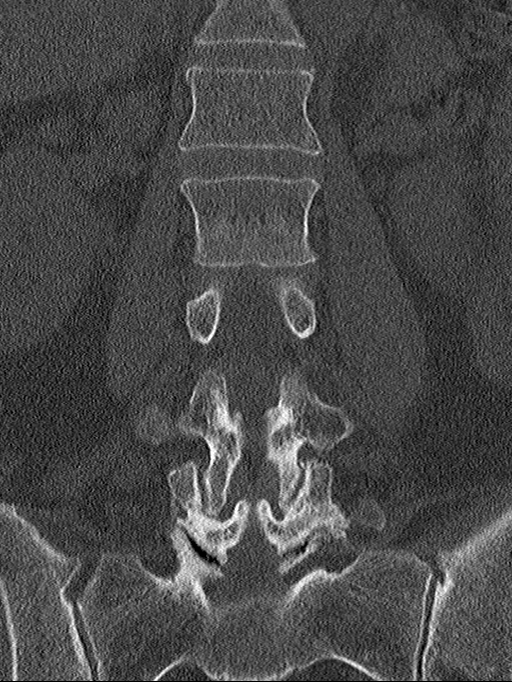
[im 46/76  bone]
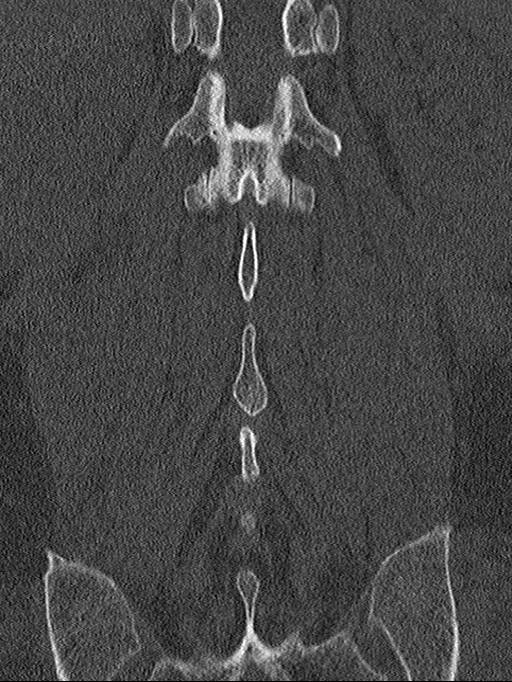

[Series 10: sagittal st · sagittal · 0.33mm/px · 5 of 61 slices shown, 6 images]
[im 21/61  bone]
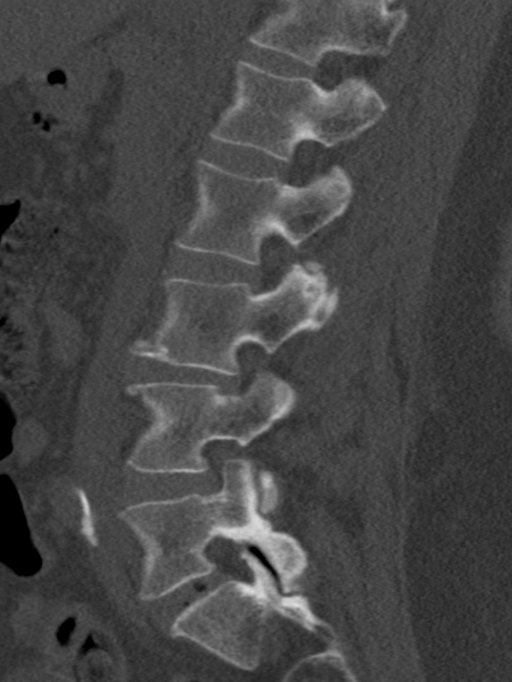
[im 26/61  bone]
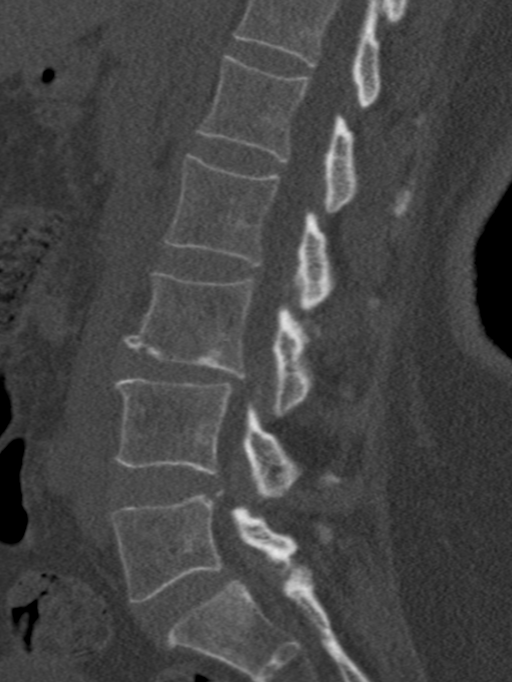
[im 31/61  soft-tissue]
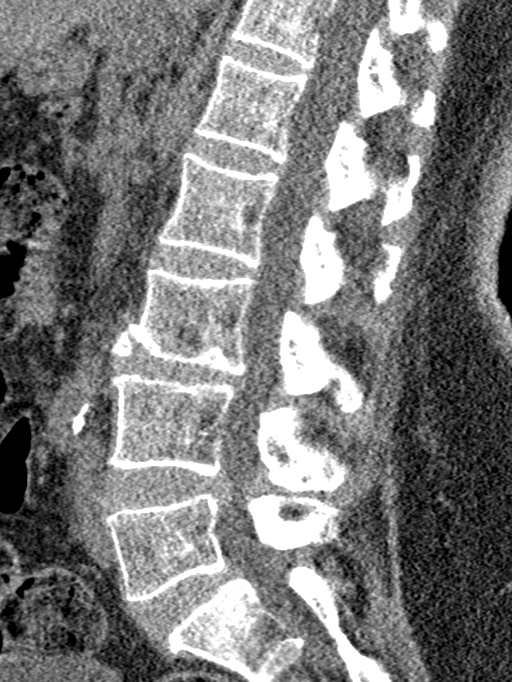
[im 31/61  bone]
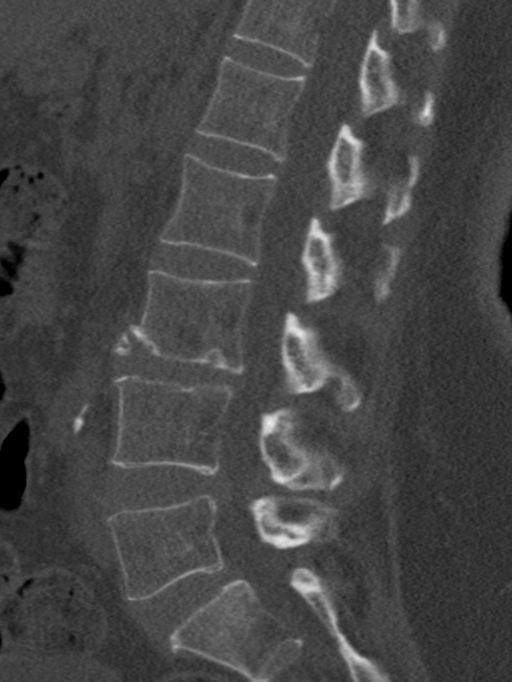
[im 36/61  bone]
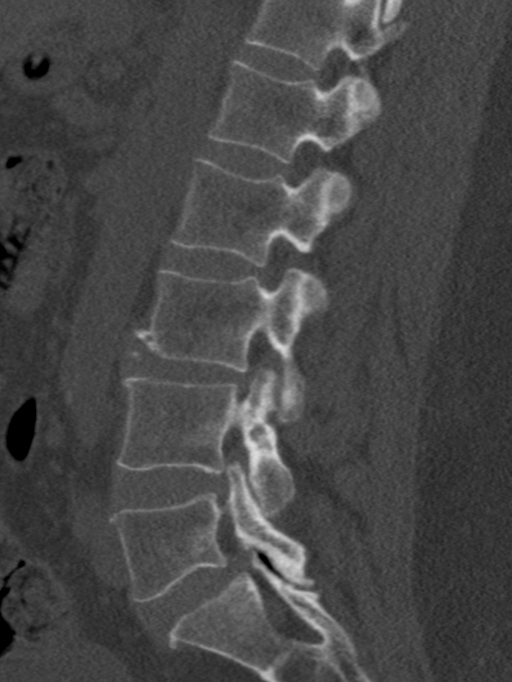
[im 41/61  bone]
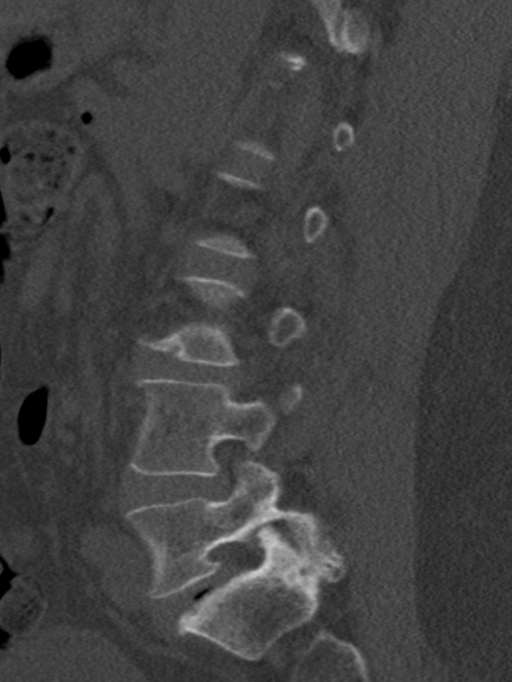

[13 of 33 positions shown; findings below may reference images not displayed]

FINDINGS: Segmentation: Standard. Lowest well-formed disc labeled the L5-S1
level.

Alignment: 2 mm retrolisthesis of L3 on L4, with 3 mm
anterolisthesis of L4 on L5, likely chronic and facet mediated.

Vertebrae: Vertebral body heights maintained without evidence for
acute or chronic fracture. Chronic degenerative endplate changes
noted at the inferior endplate of L3. Visualized sacrum and pelvis
intact. SI joints approximated symmetric. No discrete osseous
lesions.

Paraspinal and other soft tissues: Paraspinous soft tissues
demonstrate no acute finding. Aortic atherosclerosis noted.
Visualized visceral structures unremarkable.

Disc levels:

L1-2:  Unremarkable.

L2-3:  Mild facet hypertrophy, greater on the right.  No stenosis.

L3-4: Trace retrolisthesis. Diffuse disc bulge with disc desiccation
and intervertebral disc space narrowing. Mild bilateral facet
hypertrophy. Resultant mild spinal stenosis. Mild to moderate
bilateral L3 foraminal narrowing.

L4-5: Mild diffuse disc bulge. Moderate facet hypertrophy. Resultant
severe spinal stenosis. Moderate right with severe left L4 foraminal
narrowing.

L5-S1: Anterolisthesis. Mild diffuse disc bulge. Advanced bilateral
facet arthrosis. Resultant moderate canal with bilateral lateral
recess narrowing. Moderate left L5 foraminal stenosis.
IMPRESSION: 1. No acute traumatic injury within the lumbar spine.
2. Multifactorial degenerative changes at L4-5 with resultant severe
canal with moderate to severe left greater than right L4 foraminal
stenosis.
3. Trace chronic facet mediated anterolisthesis of L5 on S1 with
resultant moderate left L5 foraminal narrowing.
